# Patient Record
Sex: Female | Born: 1985
Health system: Southern US, Community
[De-identification: ages and names within clinical notes are randomized; demographics above are authoritative.]

## PROBLEM LIST (undated history)

## (undated) DIAGNOSIS — J45909 Unspecified asthma, uncomplicated: Secondary | ICD-10-CM

---

## 2017-05-22 ENCOUNTER — Encounter (HOSPITAL_BASED_OUTPATIENT_CLINIC_OR_DEPARTMENT_OTHER): Payer: Self-pay

## 2017-05-22 ENCOUNTER — Emergency Department (HOSPITAL_BASED_OUTPATIENT_CLINIC_OR_DEPARTMENT_OTHER)
Admission: EM | Admit: 2017-05-22 | Discharge: 2017-05-22 | Disposition: A | Discharge status: Home / Self Care | Payer: No Typology Code available for payment source | Attending: Emergency Medicine | Admitting: Emergency Medicine

## 2017-05-22 DIAGNOSIS — Y999 Unspecified external cause status: Secondary | ICD-10-CM | POA: Diagnosis not present

## 2017-05-22 DIAGNOSIS — Y939 Activity, unspecified: Secondary | ICD-10-CM | POA: Diagnosis not present

## 2017-05-22 DIAGNOSIS — M79676 Pain in unspecified toe(s): Secondary | ICD-10-CM | POA: Diagnosis not present

## 2017-05-22 DIAGNOSIS — S161XXA Strain of muscle, fascia and tendon at neck level, initial encounter: Secondary | ICD-10-CM | POA: Insufficient documentation

## 2017-05-22 DIAGNOSIS — Y929 Unspecified place or not applicable: Secondary | ICD-10-CM | POA: Insufficient documentation

## 2017-05-22 DIAGNOSIS — T148XXA Other injury of unspecified body region, initial encounter: Secondary | ICD-10-CM

## 2017-05-22 DIAGNOSIS — S32010A Wedge compression fracture of first lumbar vertebra, initial encounter for closed fracture: Secondary | ICD-10-CM | POA: Insufficient documentation

## 2017-05-22 DIAGNOSIS — S32010D Wedge compression fracture of first lumbar vertebra, subsequent encounter for fracture with routine healing: Secondary | ICD-10-CM

## 2017-05-22 DIAGNOSIS — S3992XA Unspecified injury of lower back, initial encounter: Secondary | ICD-10-CM | POA: Diagnosis present

## 2017-05-22 DIAGNOSIS — F17200 Nicotine dependence, unspecified, uncomplicated: Secondary | ICD-10-CM | POA: Insufficient documentation

## 2017-05-22 MED ORDER — HYDROCODONE-ACETAMINOPHEN 5-325 MG PO TABS: 1.0000 | ORAL_TABLET | Freq: Four times a day (QID) | ORAL | 0 refills | Status: DC | PRN

## 2017-05-22 MED ORDER — CYCLOBENZAPRINE HCL 10 MG PO TABS
10.0000 mg | ORAL_TABLET | Freq: Two times a day (BID) | ORAL | 0 refills | Status: DC | PRN
Start: 2017-05-22 — End: 2017-12-20
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
## 2017-05-22 NOTE — ED Notes (Addendum)
ED Provider at bedside to discuss d/c with pt. Pt pended for d/c prior to RN assessment
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
## 2017-12-20 ENCOUNTER — Encounter (HOSPITAL_BASED_OUTPATIENT_CLINIC_OR_DEPARTMENT_OTHER): Payer: Self-pay | Admitting: Emergency Medicine

## 2017-12-20 ENCOUNTER — Other Ambulatory Visit: Payer: Self-pay

## 2017-12-20 ENCOUNTER — Emergency Department (HOSPITAL_BASED_OUTPATIENT_CLINIC_OR_DEPARTMENT_OTHER)
Admission: EM | Admit: 2017-12-20 | Discharge: 2017-12-20 | Disposition: A | Discharge status: Home / Self Care | Payer: Self-pay | Attending: Emergency Medicine | Admitting: Emergency Medicine

## 2017-12-20 ENCOUNTER — Emergency Department (HOSPITAL_BASED_OUTPATIENT_CLINIC_OR_DEPARTMENT_OTHER): Payer: Self-pay

## 2017-12-20 DIAGNOSIS — J45901 Unspecified asthma with (acute) exacerbation: Secondary | ICD-10-CM | POA: Insufficient documentation

## 2017-12-20 DIAGNOSIS — F17210 Nicotine dependence, cigarettes, uncomplicated: Secondary | ICD-10-CM | POA: Insufficient documentation

## 2017-12-20 HISTORY — DX: Unspecified asthma, uncomplicated: J45.909

## 2017-12-20 MED ORDER — PREDNISONE 20 MG PO TABS: ORAL_TABLET | ORAL | 0 refills | Status: AC

## 2017-12-20 MED ORDER — IPRATROPIUM-ALBUTEROL 0.5-2.5 (3) MG/3ML IN SOLN
RESPIRATORY_TRACT | Status: AC
  Administered 2017-12-20: 9 mL
  Filled 2017-12-20: qty 9

## 2017-12-20 MED ORDER — ALBUTEROL SULFATE HFA 108 (90 BASE) MCG/ACT IN AERS
INHALATION_SPRAY | RESPIRATORY_TRACT | Status: AC
  Administered 2017-12-20: 11:00:00
  Filled 2017-12-20: qty 6.7

## 2017-12-20 MED ORDER — ONDANSETRON 4 MG PO TBDP
4.0000 mg | ORAL_TABLET | Freq: Once | ORAL | Status: AC
  Administered 2017-12-20: 4 mg via ORAL
  Filled 2017-12-20: qty 1

## 2017-12-20 MED ORDER — ONDANSETRON HCL 4 MG PO TABS: 4.0000 mg | ORAL_TABLET | Freq: Three times a day (TID) | ORAL | 0 refills | Status: AC | PRN

## 2017-12-20 MED ORDER — PREDNISONE 50 MG PO TABS
60.0000 mg | ORAL_TABLET | Freq: Once | ORAL | Status: AC
  Administered 2017-12-20: 60 mg via ORAL
  Filled 2017-12-20: qty 1

## 2017-12-20 MED FILL — ONDANSETRON HCL 4 MG TABLET: 4 | 4 days supply | Qty: 12 | Fill #0

## 2017-12-20 MED FILL — predniSONE 20 MG TABS: 20 | 4 days supply | Qty: 8 | Fill #0

## 2017-12-20 NOTE — ED Provider Notes (Signed)
[facility] EMERGENCY DEPARTMENT Provider Note   CSN: [id number] Arrival date & time: 12/20/17  4098     History   Chief Complaint Chief Complaint  Patient presents with  . Asthma    HPI Alexis Carson is a 32 y.o. female.  The history is provided by the patient and the spouse.  Asthma  This is a recurrent problem. The current episode started yesterday. The problem occurs constantly. The problem has been gradually worsening. Associated symptoms include shortness of breath. Pertinent negatives include no chest pain. Nothing aggravates the symptoms. Nothing relieves the symptoms. She has tried nothing for the symptoms. The treatment provided no relief.    Past Medical History:  Diagnosis Date  . Asthma     There are no active problems to display for this patient.   History reviewed. No pertinent surgical history.   OB History   None      Home Medications    Prior to Admission medications   Medication Sig Start Date End Date Taking? Authorizing Provider  ondansetron (ZOFRAN) 4 MG tablet Take 1 tablet (4 mg total) by mouth every 8 (eight) hours as needed for nausea or vomiting. 12/20/17   Rolande Moe, Barbara Cower, MD  predniSONE (DELTASONE) 20 MG tablet 2 tabs po daily x 4 days 12/20/17   Kinley Ferrentino, Barbara Cower, MD    Family History History reviewed. No pertinent family history.  Social History Social History   Tobacco Use  . Smoking status: Current Every Day Smoker  . Smokeless tobacco: Never Used  Substance Use Topics  . Alcohol use: Not on file  . Drug use: Not on file     Allergies   Penicillins   Review of Systems Review of Systems  Respiratory: Positive for cough and shortness of breath.   Cardiovascular: Negative for chest pain.  Gastrointestinal: Positive for diarrhea, nausea and vomiting.  All other systems reviewed and are negative.    Physical Exam Updated Vital Signs BP 132/71   Pulse 87   Temp 98.4 F (36.9 C) (Oral)   Resp 18   Ht 5'  9" (1.753 m)   Wt 68 kg (150 lb)   LMP 12/13/2017 (Approximate)   SpO2 100%   BMI 22.15 kg/m   Physical Exam  Constitutional: She appears well-developed and well-nourished.  HENT:  Head: Normocephalic and atraumatic.  Neck: Normal range of motion.  Cardiovascular: Normal rate and regular rhythm.  Pulmonary/Chest: No accessory muscle usage or stridor. Tachypnea noted. No respiratory distress. She has decreased breath sounds (diffusely). She has wheezes (RML and RLL).  Abdominal: She exhibits no distension.  Neurological: She is alert.  Nursing note and vitals reviewed.    ED Treatments / Results  Labs (all labs ordered are listed, but only abnormal results are displayed) Labs Reviewed - No data to display  EKG None  Radiology Dg Chest 2 View  Result Date: 12/20/2017 CLINICAL DATA:  Evaluate for pneumonia.  Left-sided wheezing. EXAM: CHEST - 2 VIEW COMPARISON:  None. FINDINGS: Hyperinflation of the lungs. Heart and mediastinal contours are within normal limits. No focal opacities or effusions. No acute bony abnormality. IMPRESSION: Hyperinflation.  No active disease. Electronically Signed   By: Charlett Nose M.D.   On: 12/20/2017 10:03    Procedures Procedures (including critical care time)  Medications Ordered in ED Medications  ipratropium-albuterol (DUONEB) 0.5-2.5 (3) MG/3ML nebulizer solution (9 mLs  Given 12/20/17 0914)  ondansetron (ZOFRAN-ODT) disintegrating tablet 4 mg (4 mg Oral Given 12/20/17 0924)  predniSONE (DELTASONE)  tablet 60 mg (60 mg Oral Given 12/20/17 0924)  albuterol (PROVENTIL HFA;VENTOLIN HFA) 108 (90 Base) MCG/ACT inhaler (  Given 12/20/17 1048)     Initial Impression / Assessment and Plan / ED Course  I have reviewed the triage vital signs and the nursing notes.  Pertinent labs & imaging results that were available during my care of the patient were reviewed by me and considered in my medical decision making (see chart for details).  Son the  patient had a GI illness and subsequently is now had some worsening respiratory issues.  Possibly aspirated secondary to asymmetric lung sounds but could also be pneumonia versus just a simple asthma exacerbation.  I will treat her symptomatically at this time get a chest x-ray secondary to the asymmetric breath sounds and treat her accordingly.  No respiratory distress at this time.  Significant improvement at this time. Workup negative. I preferred to watch patient a little bit longer but she was anxious to go to pick up children so was discharged but will come back if worsening. No indication for antibiotics at this time.   Final Clinical Impressions(s) / ED Diagnoses   Final diagnoses:  Exacerbation of asthma, unspecified asthma severity, unspecified whether persistent    ED Discharge Orders        Ordered    ondansetron (ZOFRAN) 4 MG tablet  Every 8 hours PRN     12/20/17 1129    predniSONE (DELTASONE) 20 MG tablet     12/20/17 1129       Burnham Trost, Barbara CowerJason, MD 12/20/17 1211

## 2017-12-20 NOTE — ED Triage Notes (Signed)
Patient reports congestion along with asthma flare over the past 2 days.  Reports she does not currently have an inhaler.

## 2017-12-20 NOTE — ED Notes (Signed)
ED Provider at bedside. 

## 2018-01-18 ENCOUNTER — Emergency Department (HOSPITAL_BASED_OUTPATIENT_CLINIC_OR_DEPARTMENT_OTHER): Payer: Self-pay

## 2018-01-18 ENCOUNTER — Encounter (HOSPITAL_BASED_OUTPATIENT_CLINIC_OR_DEPARTMENT_OTHER): Payer: Self-pay | Admitting: Emergency Medicine

## 2018-01-18 ENCOUNTER — Emergency Department (HOSPITAL_BASED_OUTPATIENT_CLINIC_OR_DEPARTMENT_OTHER)
Admission: EM | Admit: 2018-01-18 | Discharge: 2018-01-18 | Disposition: A | Discharge status: Home / Self Care | Payer: Self-pay | Attending: Emergency Medicine | Admitting: Emergency Medicine

## 2018-01-18 ENCOUNTER — Other Ambulatory Visit: Payer: Self-pay

## 2018-01-18 DIAGNOSIS — Y998 Other external cause status: Secondary | ICD-10-CM | POA: Insufficient documentation

## 2018-01-18 DIAGNOSIS — Y929 Unspecified place or not applicable: Secondary | ICD-10-CM | POA: Insufficient documentation

## 2018-01-18 DIAGNOSIS — F17200 Nicotine dependence, unspecified, uncomplicated: Secondary | ICD-10-CM | POA: Insufficient documentation

## 2018-01-18 DIAGNOSIS — M436 Torticollis: Secondary | ICD-10-CM | POA: Insufficient documentation

## 2018-01-18 DIAGNOSIS — Y9383 Activity, rough housing and horseplay: Secondary | ICD-10-CM | POA: Insufficient documentation

## 2018-01-18 DIAGNOSIS — T71193A Asphyxiation due to mechanical threat to breathing due to other causes, assault, initial encounter: Secondary | ICD-10-CM | POA: Insufficient documentation

## 2018-01-18 DIAGNOSIS — J45909 Unspecified asthma, uncomplicated: Secondary | ICD-10-CM | POA: Insufficient documentation

## 2018-01-18 MED ORDER — CYCLOBENZAPRINE HCL 10 MG PO TABS: 5.0000 mg | ORAL_TABLET | Freq: Every day | ORAL | 0 refills | Status: AC

## 2018-01-18 MED ORDER — ACETAMINOPHEN 500 MG PO TABS: 1000.0000 mg | ORAL_TABLET | Freq: Three times a day (TID) | ORAL | 0 refills | Status: AC

## 2018-01-18 MED ORDER — CYCLOBENZAPRINE HCL 5 MG PO TABS
5.0000 mg | ORAL_TABLET | Freq: Once | ORAL | Status: AC
  Administered 2018-01-18: 5 mg via ORAL
  Filled 2018-01-18: qty 1

## 2018-01-18 MED ORDER — KETOROLAC TROMETHAMINE 60 MG/2ML IM SOLN
30.0000 mg | Freq: Once | INTRAMUSCULAR | Status: AC
  Administered 2018-01-18: 30 mg via INTRAMUSCULAR
  Filled 2018-01-18: qty 2

## 2018-01-18 MED ORDER — KETOROLAC TROMETHAMINE 60 MG/2ML IM SOLN: 30.0000 mg | Freq: Once | INTRAMUSCULAR | Status: DC
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
## 2018-01-18 NOTE — ED Notes (Signed)
Patient ambulatory to CT; steady gait noted.

## 2018-01-18 NOTE — ED Notes (Signed)
ED Provider at bedside. 

## 2018-02-27 ENCOUNTER — Emergency Department (HOSPITAL_BASED_OUTPATIENT_CLINIC_OR_DEPARTMENT_OTHER)
Admission: EM | Admit: 2018-02-27 | Discharge: 2018-02-27 | Disposition: A | Discharge status: Home / Self Care | Payer: Self-pay | Attending: Emergency Medicine | Admitting: Emergency Medicine

## 2018-02-27 ENCOUNTER — Encounter (HOSPITAL_BASED_OUTPATIENT_CLINIC_OR_DEPARTMENT_OTHER): Payer: Self-pay | Admitting: Adult Health

## 2018-02-27 ENCOUNTER — Emergency Department (HOSPITAL_BASED_OUTPATIENT_CLINIC_OR_DEPARTMENT_OTHER): Payer: Self-pay

## 2018-02-27 ENCOUNTER — Other Ambulatory Visit: Payer: Self-pay

## 2018-02-27 DIAGNOSIS — J45909 Unspecified asthma, uncomplicated: Secondary | ICD-10-CM | POA: Insufficient documentation

## 2018-02-27 DIAGNOSIS — J020 Streptococcal pharyngitis: Secondary | ICD-10-CM | POA: Insufficient documentation

## 2018-02-27 DIAGNOSIS — F17200 Nicotine dependence, unspecified, uncomplicated: Secondary | ICD-10-CM | POA: Insufficient documentation

## 2018-02-27 DIAGNOSIS — R0789 Other chest pain: Secondary | ICD-10-CM | POA: Insufficient documentation

## 2018-02-27 LAB — RAPID STREP SCREEN (MED CTR MEBANE ONLY): Streptococcus, Group A Screen (Direct): POSITIVE — AB

## 2018-02-27 MED ORDER — CLINDAMYCIN HCL 300 MG PO CAPS: 300.0000 mg | ORAL_CAPSULE | Freq: Three times a day (TID) | ORAL | 0 refills | Status: AC
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
## 2018-02-27 NOTE — ED Notes (Signed)
ED Provider at bedside. 

## 2018-02-27 NOTE — Discharge Instructions (Signed)
Take Tylenol and Motrin for pain.  Salt water gargles.  Take clindamycin until all gone.  Follow-up with family doctor as needed

## 2019-09-01 IMAGING — CR DG CHEST 2V
2 series · 2 of 2 positions shown · non-contrast
Comparison: Chest x-ray of December 20, 2017

CLINICAL DATA: Sore throat and painful swallowing for the past
week. History of asthma. Current smoker.

EXAM:
CHEST - 2 VIEW

[w chest pa]
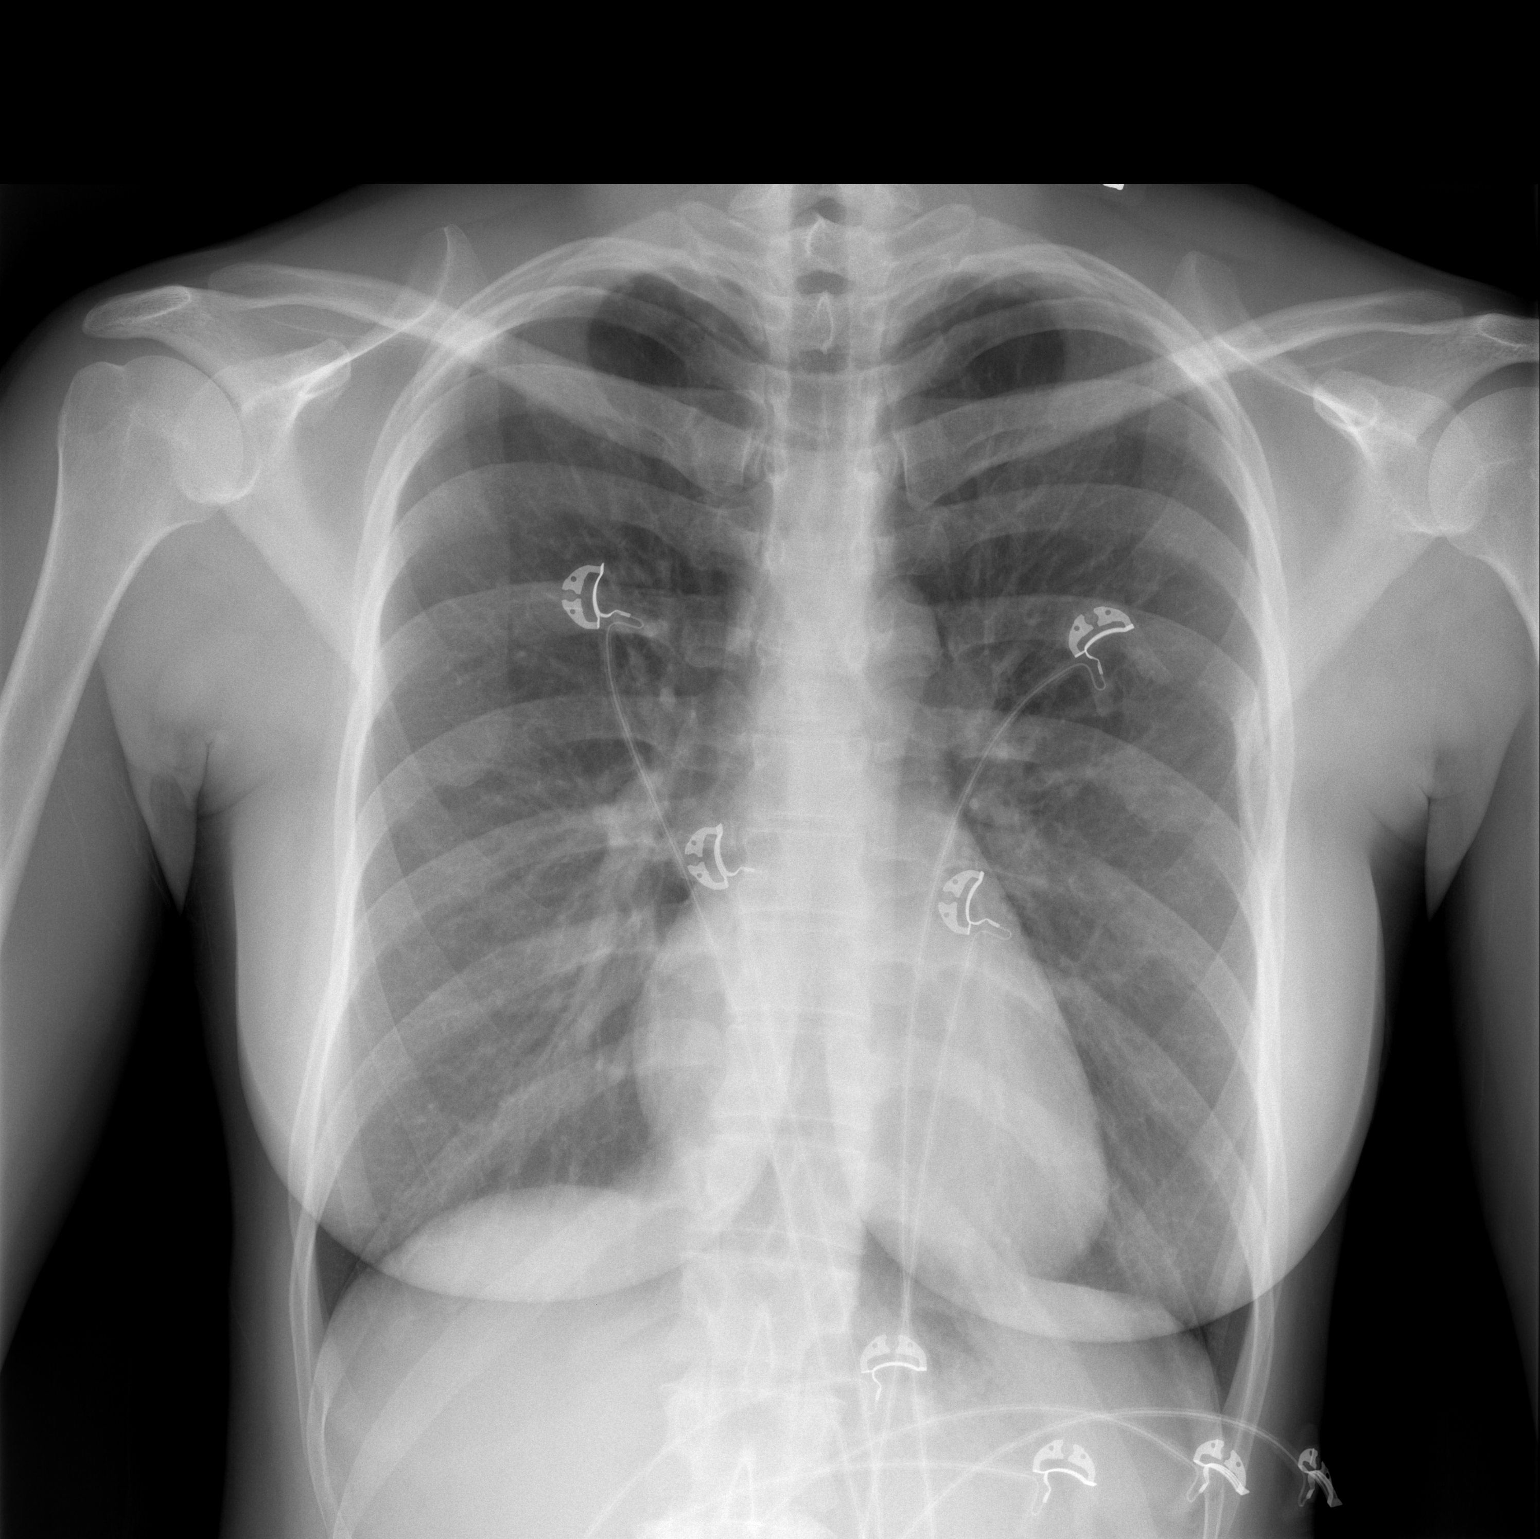

[w chest lat]
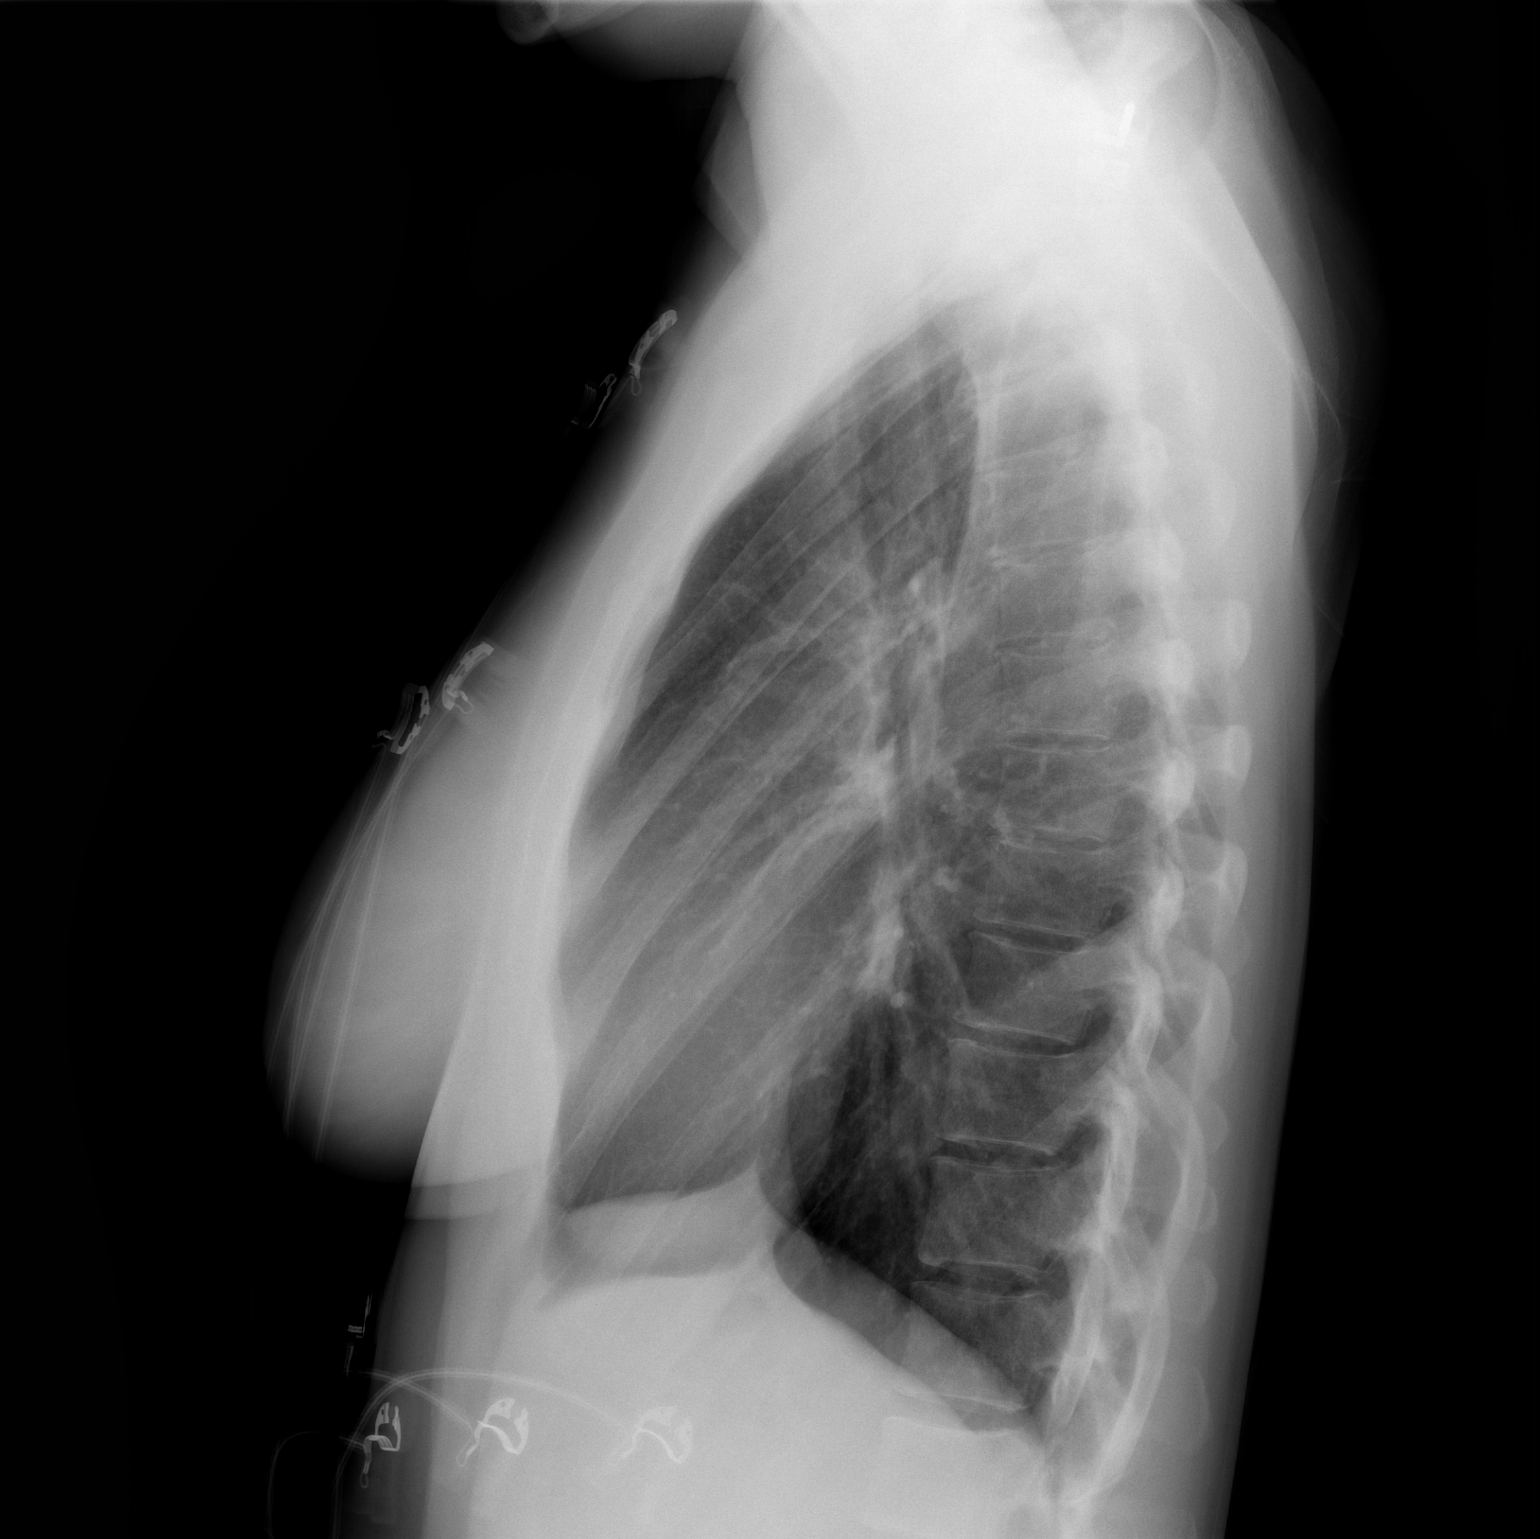

[2 of 2 positions shown; findings below may reference images not displayed]

FINDINGS: The lungs are mildly hyperinflated but clear. The heart and
mediastinal structures are normal. There is no pleural effusion. The
bony thorax is unremarkable.
IMPRESSION: Mild hyperinflation consistent with reactive airway disease. There
is no pneumonia nor other acute cardiopulmonary abnormality.
